# Patient Record
Sex: Female | Born: 1990 | Race: White | Hispanic: No | Marital: Single
Health system: Southern US, Community
[De-identification: ages and names within clinical notes are randomized; demographics above are authoritative.]

## PROBLEM LIST (undated history)

## (undated) DIAGNOSIS — R569 Unspecified convulsions: Secondary | ICD-10-CM

---

## 2016-01-30 ENCOUNTER — Emergency Department (HOSPITAL_COMMUNITY): Payer: 59

## 2016-01-30 ENCOUNTER — Encounter (HOSPITAL_COMMUNITY): Payer: Self-pay | Admitting: Emergency Medicine

## 2016-01-30 ENCOUNTER — Emergency Department (HOSPITAL_COMMUNITY)
Admission: EM | Admit: 2016-01-30 | Discharge: 2016-01-30 | Disposition: A | Discharge status: Home / Self Care | Payer: 59 | Attending: Emergency Medicine | Admitting: Emergency Medicine

## 2016-01-30 DIAGNOSIS — R404 Transient alteration of awareness: Secondary | ICD-10-CM | POA: Diagnosis not present

## 2016-01-30 DIAGNOSIS — Z79899 Other long term (current) drug therapy: Secondary | ICD-10-CM | POA: Insufficient documentation

## 2016-01-30 DIAGNOSIS — F1423 Cocaine dependence with withdrawal: Secondary | ICD-10-CM | POA: Diagnosis present

## 2016-01-30 HISTORY — DX: Unspecified convulsions: R56.9

## 2016-01-30 LAB — CBC WITH DIFFERENTIAL/PLATELET
BASOS ABS: 0 10*3/uL (ref 0.0–0.1)
BASOS PCT: 0 %
EOS ABS: 0.1 10*3/uL (ref 0.0–0.7)
EOS PCT: 2 %
HCT: 36.4 % (ref 36.0–46.0)
Hemoglobin: 12.7 g/dL (ref 12.0–15.0)
Lymphocytes Relative: 36 %
Lymphs Abs: 1.8 10*3/uL (ref 0.7–4.0)
MCH: 30.7 pg (ref 26.0–34.0)
MCHC: 34.9 g/dL (ref 30.0–36.0)
MCV: 87.9 fL (ref 78.0–100.0)
MONO ABS: 0.5 10*3/uL (ref 0.1–1.0)
MONOS PCT: 10 %
Neutro Abs: 2.6 10*3/uL (ref 1.7–7.7)
Neutrophils Relative %: 52 %
PLATELETS: 186 10*3/uL (ref 150–400)
RBC: 4.14 MIL/uL (ref 3.87–5.11)
RDW: 12 % (ref 11.5–15.5)
WBC: 5 10*3/uL (ref 4.0–10.5)

## 2016-01-30 LAB — BASIC METABOLIC PANEL
Anion gap: 6 (ref 5–15)
BUN: 13 mg/dL (ref 6–20)
CHLORIDE: 102 mmol/L (ref 101–111)
CO2: 28 mmol/L (ref 22–32)
CREATININE: 0.9 mg/dL (ref 0.44–1.00)
Calcium: 8.9 mg/dL (ref 8.9–10.3)
GFR calc non Af Amer: >60 mL/min (ref >60)
Glucose, Bld: 96 mg/dL (ref 65–99)
POTASSIUM: 4.8 mmol/L (ref 3.5–5.1)
Sodium: 136 mmol/L (ref 135–145)

## 2016-01-30 LAB — RAPID URINE DRUG SCREEN, HOSP PERFORMED
AMPHETAMINES: NOT DETECTED
BARBITURATES: NOT DETECTED
BENZODIAZEPINES: POSITIVE — AB
COCAINE: NOT DETECTED
Opiates: NOT DETECTED
TETRAHYDROCANNABINOL: POSITIVE — AB

## 2016-01-30 LAB — ETHANOL

## 2016-01-30 MED ORDER — METHOCARBAMOL 500 MG PO TABS
1000.0000 mg | ORAL_TABLET | Freq: Once | ORAL | Status: AC
  Administered 2016-01-30: 1000 mg via ORAL
  Filled 2016-01-30: qty 2

## 2016-01-30 MED ORDER — LORAZEPAM 1 MG PO TABS
2.0000 mg | ORAL_TABLET | Freq: Once | ORAL | Status: AC
  Administered 2016-01-30: 2 mg via ORAL
  Filled 2016-01-30: qty 2
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
## 2016-01-30 NOTE — ED Notes (Signed)
Bed: [id number] Expected date:  Expected time:  Means of arrival:  Comments: EMS- 25yo F, AMS/from treatment facility
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
## 2016-01-30 NOTE — ED Notes (Addendum)
[facility] alcohol treatment. Hx of seizures with withdrawal. Staff reported altered mental status, and lethargy this morning. AAOx4 currently.

## 2016-01-30 NOTE — ED Notes (Signed)
Patient transported to CT 

## 2016-01-30 NOTE — ED Notes (Signed)
[facility] arrived to pick up patient. Patient was ambulatory & escorted to the car.

## 2016-01-30 NOTE — Progress Notes (Signed)
Pt states she in here in [place] for alcohol rehab and is from [place]  Pt states she sees doctors at the urgent care in [place]  [facility] ED CM noted pt with coverage but no pcp listed [facility] ED CM spoke with pt on how to obtain an in network pcp with insurance coverage via the customer service number or web site  Cm reviewed ED level of care for crisis/emergent services and community pcp level of care to manage continuous or chronic medical concerns.  The pt voiced understanding CM encouraged pt and discussed pt's responsibility to verify with pt's insurance carrier that any recommended medical provider offered by any emergency room or a hospital provider is within the carrier's network. The pt voiced understanding
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
